# Patient Record
Sex: Male | Born: 1970
Health system: Southern US, Community
[De-identification: ages and names within clinical notes are randomized; demographics above are authoritative.]

## PROBLEM LIST (undated history)

## (undated) ENCOUNTER — Emergency Department (HOSPITAL_COMMUNITY): Payer: Self-pay | Source: Home / Self Care

## (undated) DIAGNOSIS — N289 Disorder of kidney and ureter, unspecified: Secondary | ICD-10-CM

## (undated) DIAGNOSIS — I10 Essential (primary) hypertension: Secondary | ICD-10-CM

## (undated) DIAGNOSIS — J45909 Unspecified asthma, uncomplicated: Secondary | ICD-10-CM

## (undated) DIAGNOSIS — I509 Heart failure, unspecified: Secondary | ICD-10-CM

## (undated) DIAGNOSIS — E119 Type 2 diabetes mellitus without complications: Secondary | ICD-10-CM

---

## 2017-01-04 ENCOUNTER — Inpatient Hospital Stay
Admission: RE | Admit: 2017-01-04 | Discharge: 2017-01-14 | Payer: Self-pay | Attending: Internal Medicine | Admitting: Internal Medicine

## 2017-01-04 DIAGNOSIS — R109 Unspecified abdominal pain: Secondary | ICD-10-CM

## 2017-01-04 DIAGNOSIS — Z4689 Encounter for fitting and adjustment of other specified devices: Secondary | ICD-10-CM

## 2017-01-04 DIAGNOSIS — R140 Abdominal distension (gaseous): Secondary | ICD-10-CM

## 2017-01-04 DIAGNOSIS — Z0189 Encounter for other specified special examinations: Secondary | ICD-10-CM

## 2017-01-04 DIAGNOSIS — Z4659 Encounter for fitting and adjustment of other gastrointestinal appliance and device: Secondary | ICD-10-CM

## 2017-01-04 DIAGNOSIS — R52 Pain, unspecified: Secondary | ICD-10-CM

## 2017-01-04 HISTORY — DX: Essential (primary) hypertension: I10

## 2017-01-04 HISTORY — DX: Disorder of kidney and ureter, unspecified: N28.9

## 2017-01-04 HISTORY — DX: Unspecified asthma, uncomplicated: J45.909

## 2017-01-04 HISTORY — DX: Heart failure, unspecified: I50.9

## 2017-01-04 HISTORY — DX: Type 2 diabetes mellitus without complications: E11.9

## 2017-01-05 LAB — LIPASE, BLOOD: LIPASE: 287 U/L — AB (ref 11–51)

## 2017-01-07 ENCOUNTER — Other Ambulatory Visit (HOSPITAL_COMMUNITY): Payer: Self-pay

## 2017-01-07 LAB — COMPREHENSIVE METABOLIC PANEL
ALBUMIN: 2.6 g/dL — AB (ref 3.5–5.0)
ALK PHOS: 98 U/L (ref 38–126)
ALT: 32 U/L (ref 17–63)
ALT: 32 U/L (ref 17–63)
ANION GAP: 11 (ref 5–15)
AST: 49 U/L — ABNORMAL HIGH (ref 15–41)
AST: 37 U/L (ref 15–41)
Albumin: 2.3 g/dL — ABNORMAL LOW (ref 3.5–5.0)
Alkaline Phosphatase: 107 U/L (ref 38–126)
Anion gap: 11 (ref 5–15)
BILIRUBIN TOTAL: 0.8 mg/dL (ref 0.3–1.2)
BUN: 25 mg/dL — ABNORMAL HIGH (ref 6–20)
BUN: 15 mg/dL (ref 6–20)
CALCIUM: 8.4 mg/dL — AB (ref 8.9–10.3)
CALCIUM: 8.6 mg/dL — AB (ref 8.9–10.3)
CO2: 24 mmol/L (ref 22–32)
CO2: 24 mmol/L (ref 22–32)
CREATININE: 0.95 mg/dL (ref 0.61–1.24)
CREATININE: 0.92 mg/dL (ref 0.61–1.24)
Chloride: 104 mmol/L (ref 101–111)
Chloride: 103 mmol/L (ref 101–111)
GFR calc Af Amer: >60 mL/min (ref >60)
GFR calc non Af Amer: >60 mL/min (ref >60)
GLUCOSE: 196 mg/dL — AB (ref 65–99)
Glucose, Bld: 252 mg/dL — ABNORMAL HIGH (ref 65–99)
Potassium: 3.2 mmol/L — ABNORMAL LOW (ref 3.5–5.1)
Potassium: 3.4 mmol/L — ABNORMAL LOW (ref 3.5–5.1)
SODIUM: 139 mmol/L (ref 135–145)
SODIUM: 138 mmol/L (ref 135–145)
TOTAL PROTEIN: 5.7 g/dL — AB (ref 6.5–8.1)
Total Bilirubin: 0.9 mg/dL (ref 0.3–1.2)
Total Protein: 5.7 g/dL — ABNORMAL LOW (ref 6.5–8.1)

## 2017-01-07 LAB — CBC
HCT: 29.1 % — ABNORMAL LOW (ref 39.0–52.0)
Hemoglobin: 9.1 g/dL — ABNORMAL LOW (ref 13.0–17.0)
MCH: 28.3 pg (ref 26.0–34.0)
MCHC: 31.3 g/dL (ref 30.0–36.0)
MCV: 90.4 fL (ref 78.0–100.0)
PLATELETS: 482 10*3/uL — AB (ref 150–400)
RBC: 3.22 MIL/uL — AB (ref 4.22–5.81)
RDW: 13.9 % (ref 11.5–15.5)
WBC: 13 10*3/uL — ABNORMAL HIGH (ref 4.0–10.5)

## 2017-01-07 LAB — LIPASE, BLOOD: Lipase: 296 U/L — ABNORMAL HIGH (ref 11–51)

## 2017-01-08 LAB — POTASSIUM: Potassium: 3.5 mmol/L (ref 3.5–5.1)

## 2017-01-08 LAB — MAGNESIUM: MAGNESIUM: 2 mg/dL (ref 1.7–2.4)

## 2017-01-09 ENCOUNTER — Encounter: Payer: Self-pay | Admitting: Radiology

## 2017-01-09 ENCOUNTER — Other Ambulatory Visit (HOSPITAL_COMMUNITY): Payer: Self-pay

## 2017-01-09 MED ORDER — IOPAMIDOL (ISOVUE-300) INJECTION 61%
INTRAVENOUS | Status: AC
  Administered 2017-01-09: 100 mL
  Filled 2017-01-09: qty 100

## 2017-01-10 LAB — LIPASE, BLOOD: Lipase: 249 U/L — ABNORMAL HIGH (ref 11–51)

## 2017-01-11 ENCOUNTER — Other Ambulatory Visit (HOSPITAL_COMMUNITY): Payer: Self-pay

## 2017-01-11 MED ORDER — LIDOCAINE VISCOUS 2 % MT SOLN
OROMUCOSAL | Status: AC
Start: 2017-01-11 — End: 2017-01-11
  Filled 2017-01-11: qty 15

## 2017-01-11 MED ORDER — IOPAMIDOL (ISOVUE-300) INJECTION 61%
INTRAVENOUS | Status: AC
  Filled 2017-01-11: qty 50

## 2017-01-11 MED ORDER — IOPAMIDOL (ISOVUE-300) INJECTION 61%
50.0000 mL | Freq: Once | INTRAVENOUS | Status: AC | PRN
  Administered 2017-01-11: 30 mL via INTRAVENOUS

## 2017-01-11 MED ORDER — LIDOCAINE VISCOUS 2 % MT SOLN
15.0000 mL | Freq: Once | OROMUCOSAL | Status: AC
  Administered 2017-01-11: 3 mL via OROMUCOSAL

## 2017-01-12 NOTE — Consult Note (Signed)
[facility]  Prior consultation note already in chart.

## 2017-01-12 NOTE — Consult Note (Signed)
[facility] Consultation Note  Referring Provider: Dr. Sharyon MedicusHijazi Scientist, product/process development([facility]) Primary Care Physician:  No primary care provider on file.  Reason for Consultation:  pancreatitis  HPI: Alphonsa GinJanson Mandarino is a 46 y.o. male admitted for management of pancreatitis, transferred from outside facility where he presented with pancreatitis about two weeks ago, complicated by ARDS/renal issues (had been intubated but now extubated).  Outside records scant. Etiology of pancreatitis unclear; he denies alcohol > 10 years; no clear gallstones noted; possibly medication related; MRCP outside facility showed pancreas divisum but no santorinicele; no obvious choledocholithiasis.  Patient, despite extensive attempts at conversation, has very little information to add; he has abdominal pain and nausea, but can't tell me when symptoms started, doesn't know if he's ever had pancreatitis before.  CT recently showed large peripancreatic fluid collection (with a bit of a rind based on my evaluation) with possible necrosis and some gastric compression and some ascites.   Past Medical History:  Diagnosis Date  . Asthma   . CHF (congestive heart failure) (HCC)   . Diabetes mellitus without complication (HCC)   . Hypertension   . Renal insufficiency     No past surgical history on file.  Prior to Admission medications   Not on File    No current facility-administered medications for this encounter.     Allergies as of 01/04/2017  . (Not on File)    No family history on file.  Social History   Social History  . Marital status: N/A    Spouse name: N/A  . Number of children: N/A  . Years of education: N/A   Occupational History  . Not on file.   Social History Main Topics  . Smoking status: Not on file  . Smokeless tobacco: Not on file  . Alcohol use Not on file  . Drug use: Unknown  . Sexual activity: Not on file   Other Topics Concern  . Not on file   Social History Narrative  .  No narrative on file    Review of Systems: As per HPI, others negative  Physical Exam: Vital signs in last 24 hours:     General:   Alert,  Well-developed, well-nourished, pleasant and cooperative in NAD Head:  Normocephalic and atraumatic. Eyes:  Sclera clear, no icterus.   Conjunctiva pink. Ears:  Normal auditory acuity. Nose:  Nasoenteric tube in place, No deformity, discharge,  or lesions. Mouth:  No deformity or lesions.  Oropharynx pink & moist. Neck:  Supple; no masses or thyromegaly. Lungs:  Clear throughout to auscultation.   No wheezes, crackles, or rhonchi. No acute distress. Heart:  Regular rate and rhythm; no murmurs, clicks, rubs,  or gallops. Abdomen:  Soft,protuberant, mild distended, generalized tenderness. No masses, hepatosplenomegaly or hernias noted. Normal bowel sounds, without guarding, and without rebound.     Msk:  Symmetrical without gross deformities. Normal posture. Pulses:  Normal pulses noted. Extremities:  Without clubbing or edema. Neurologic:  Alert and  oriented x4;  grossly normal neurologically. Skin:  Intact without significant lesions or rashes. Psych:  Alert and cooperative. Normal mood and affect.   Lab Results: No results for input(s): WBC, HGB, HCT, PLT in the last 72 hours. BMET No results for input(s): NA, K, CL, CO2, GLUCOSE, BUN, CREATININE, CALCIUM in the last 72 hours. LFT No results for input(s): PROT, ALBUMIN, AST, ALT, ALKPHOS, BILITOT, BILIDIR, IBILI in the last 72 hours. PT/INR No results for input(s): LABPROT, INR in the last 72 hours.  Studies/Results:  Dg Abd 1 View  Result Date: 01/11/2017 CLINICAL DATA:  Enteric tube placement EXAM: ABDOMEN - 1 VIEW COMPARISON:  01/09/2017 CT abdomen/ pelvis. FINDINGS: Fluoroscopy time 0 minutes 24 seconds. Single spot fluoroscopic radiographs of the abdomen demonstrates enteric tube tip in the transverse duodenum. Instilled contrast is noted within the lumen of the distal duodenum. No  evidence of pneumatosis or pneumoperitoneum on this single view. IMPRESSION: Enteric tube terminates in the transverse duodenum. Electronically Signed   By: Delbert Phenix M.D.   On: 01/11/2017 08:48   Impression:  1.  Pancreatitis, idiopathic, severe, with prior ARDS and now likely necrosis with acute necrotic peripancreatic fluid collection which appears to be evolving into walled off necrosis.  No clear evidence of infected necrosis.  Plan:  1.  Agree with nasoenteric feeds, advancing as tolerated. 2.  Would try some sips of clear liquids and see how he does. 3.  If trouble tolerating any kind of peroral diet, or if he has escalating pain, would consider transfer to tertiary center for consideration of endoscopic cystgastrostomy (procedure which is not done by any physician in [place]). 4.  Judicious analgesics, noting that narcotics may well be exacerbating his nausea. 5.  Labs today. 6.  [facility] will follow.   LOS: 0 days   Kingstyn Deruiter M  01/12/2017, 11:34 AM  Pager [phone] If no answer or after 5 PM call [phone]

## 2017-01-13 LAB — CBC WITH DIFFERENTIAL/PLATELET
Basophils Absolute: 0.1 10*3/uL (ref 0.0–0.1)
Basophils Relative: 0 %
EOS ABS: 0.6 10*3/uL (ref 0.0–0.7)
Eosinophils Relative: 4 %
HEMATOCRIT: 31.8 % — AB (ref 39.0–52.0)
Hemoglobin: 10.2 g/dL — ABNORMAL LOW (ref 13.0–17.0)
LYMPHS ABS: 3 10*3/uL (ref 0.7–4.0)
LYMPHS PCT: 19 %
MCH: 28.5 pg (ref 26.0–34.0)
MCHC: 32.1 g/dL (ref 30.0–36.0)
MCV: 88.8 fL (ref 78.0–100.0)
MONO ABS: 1.1 10*3/uL — AB (ref 0.1–1.0)
MONOS PCT: 7 %
NEUTROS PCT: 70 %
Neutro Abs: 11 10*3/uL — ABNORMAL HIGH (ref 1.7–7.7)
Platelets: 612 10*3/uL — ABNORMAL HIGH (ref 150–400)
RBC: 3.58 MIL/uL — ABNORMAL LOW (ref 4.22–5.81)
RDW: 14.1 % (ref 11.5–15.5)
WBC: 15.8 10*3/uL — AB (ref 4.0–10.5)

## 2017-01-13 LAB — COMPREHENSIVE METABOLIC PANEL
ALT: 50 U/L (ref 17–63)
ANION GAP: 11 (ref 5–15)
AST: 56 U/L — AB (ref 15–41)
Albumin: 2.2 g/dL — ABNORMAL LOW (ref 3.5–5.0)
Alkaline Phosphatase: 324 U/L — ABNORMAL HIGH (ref 38–126)
BILIRUBIN TOTAL: 0.8 mg/dL (ref 0.3–1.2)
BUN: 18 mg/dL (ref 6–20)
CO2: 24 mmol/L (ref 22–32)
Calcium: 8.5 mg/dL — ABNORMAL LOW (ref 8.9–10.3)
Chloride: 96 mmol/L — ABNORMAL LOW (ref 101–111)
Creatinine, Ser: 0.81 mg/dL (ref 0.61–1.24)
Glucose, Bld: 176 mg/dL — ABNORMAL HIGH (ref 65–99)
POTASSIUM: 4 mmol/L (ref 3.5–5.1)
Sodium: 131 mmol/L — ABNORMAL LOW (ref 135–145)
TOTAL PROTEIN: 5.4 g/dL — AB (ref 6.5–8.1)

## 2017-01-13 NOTE — Progress Notes (Signed)
Wife called us back; she wants to proceed with tertiary center transfer to tertiary center for evaluation of possible endoscopic cystgastrostomy.  Please let me know if there is any way I can be of further assistance.

## 2017-01-13 NOTE — Progress Notes (Signed)
Subjective: Continued intermittent abdominal pain and nausea.  Objective: PE: GEN:  NAD ABD:  Protuberant, distended, diffusely mildly tender without peritonitis, hyperactive bowel sounds  Lab Results: CBC    Component Value Date/Time   WBC 15.8 (H) 01/13/2017 0712   RBC 3.58 (L) 01/13/2017 0712   HGB 10.2 (L) 01/13/2017 0712   HCT 31.8 (L) 01/13/2017 0712   PLT 612 (H) 01/13/2017 0712   MCV 88.8 01/13/2017 0712   MCH 28.5 01/13/2017 0712   MCHC 32.1 01/13/2017 0712   RDW 14.1 01/13/2017 0712   LYMPHSABS 3.0 01/13/2017 0712   MONOABS 1.1 (H) 01/13/2017 0712   EOSABS 0.6 01/13/2017 0712   BASOSABS 0.1 01/13/2017 0712   CMP     Component Value Date/Time   NA 131 (L) 01/13/2017 0712   K 4.0 01/13/2017 0712   CL 96 (L) 01/13/2017 0712   CO2 24 01/13/2017 0712   GLUCOSE 176 (H) 01/13/2017 0712   BUN 18 01/13/2017 0712   CREATININE 0.81 01/13/2017 0712   CALCIUM 8.5 (L) 01/13/2017 0712   PROT 5.4 (L) 01/13/2017 0712   ALBUMIN 2.2 (L) 01/13/2017 0712   AST 56 (H) 01/13/2017 0712   ALT 50 01/13/2017 0712   ALKPHOS 324 (H) 01/13/2017 0712   BILITOT 0.8 01/13/2017 0712   GFRNONAA >60 01/13/2017 0712   GFRAA >60 01/13/2017 [id number]   Assessment:  1.  Gallstone pancreatitis, severe, with large evolving walled off necrotic fluid collection.  Fluid collection compresses stomach and portion of portal vein.  No biliary obstruction.  Nausea but no frank gastric outlet obstruction. 2.  Abdominal pain.  Unclear how much is due from pancreatitis or cyst. 3.  Feeding difficulties. 4.  Protein calorie malnutrition.  Plan:  1.  Nasoenteric feeds. 2.  Trial of clear liquids is ok. 3.  Discussed with patient and his wife Beth option of tertiary center transfer for consideration of endoscopic cystgastrostomy.  They will think things over and make a decision in the next day or so. 4.  Judicious analgesics. 5.  [facility] will follow.   Freddy JakschOUTLAW,Cera Rorke M 01/13/2017, 9:11 AM   Pager  [phone] If no answer or after 5 PM call [phone]

## 2017-01-14 LAB — IGG 4: IGG 4: 35 mg/dL (ref 2–96)

## 2017-01-14 LAB — ANTINUCLEAR ANTIBODIES, IFA: ANA Ab, IFA: NEGATIVE

## 2017-02-03 DEATH — deceased

## 2018-10-12 IMAGING — CT CT ABD-PELV W/ CM
2 of 4 series · 13 of 42 positions shown, 15 images · IV contrast (iopamidol)
Comparison: None.

CLINICAL DATA: Recent pancreatitis with increasing pain. Nausea and
vomiting.

EXAM:
CT ABDOMEN AND PELVIS WITH CONTRAST
TECHNIQUE: Multidetector CT imaging of the abdomen and pelvis was performed
using the standard protocol following bolus administration of
intravenous contrast.
CONTRAST:  100mL [id number] IOPAMIDOL ([id number]) INJECTION 61%

[Series 3: a/p w/ 5mm · axial · 0.71mm/px · z∈[+682,+1132]mm · 10 of 100 slices shown, 12 images]
[im 5/100  soft-tissue]
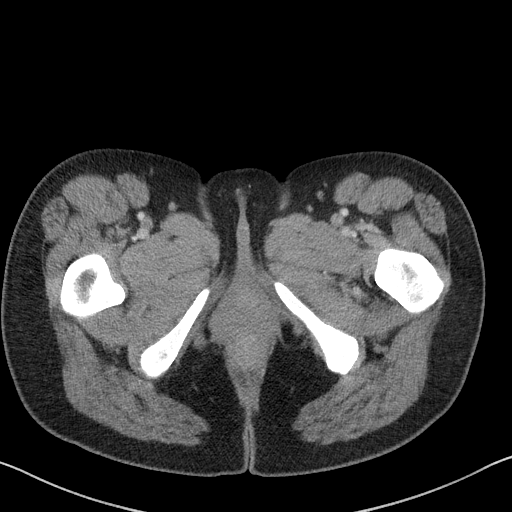
[im 5/100  bone]
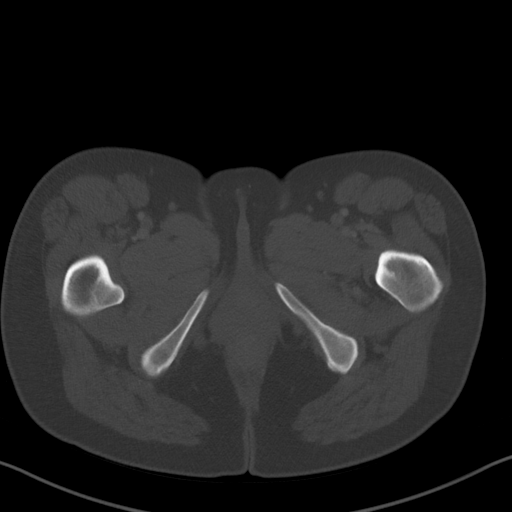
[im 15/100  soft-tissue]
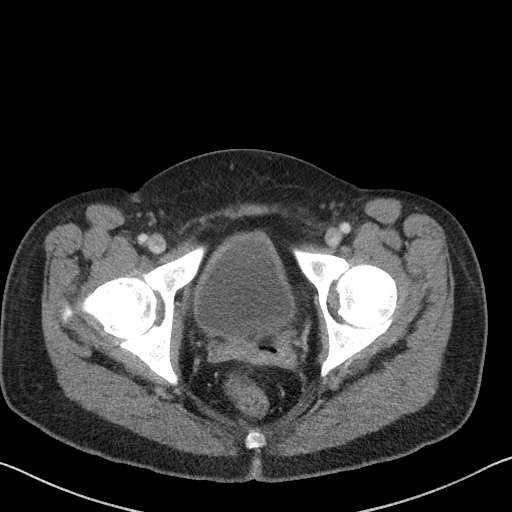
[im 25/100  soft-tissue]
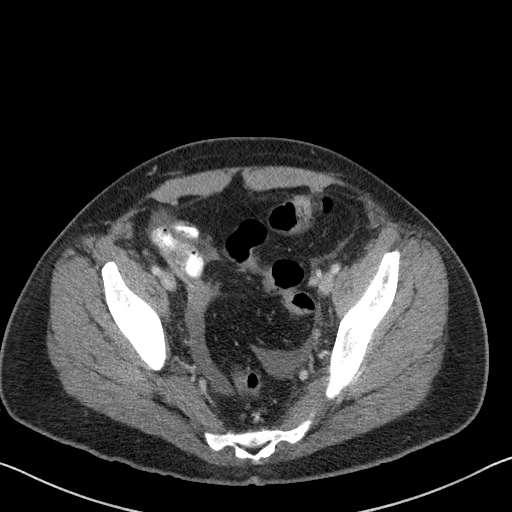
[im 35/100  soft-tissue]
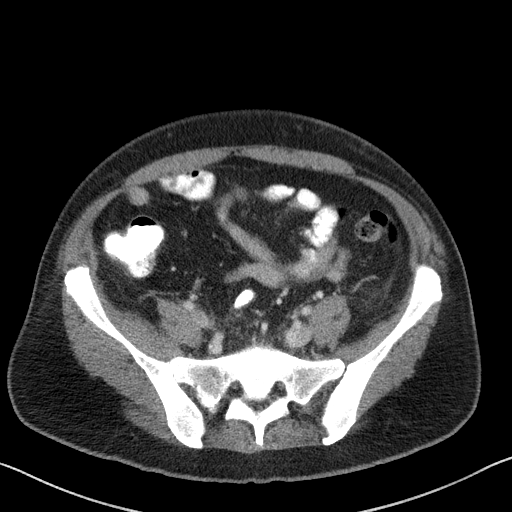
[im 45/100  soft-tissue]
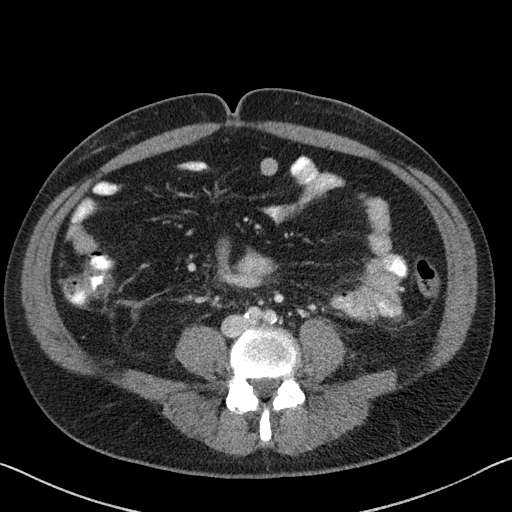
[im 55/100  soft-tissue]
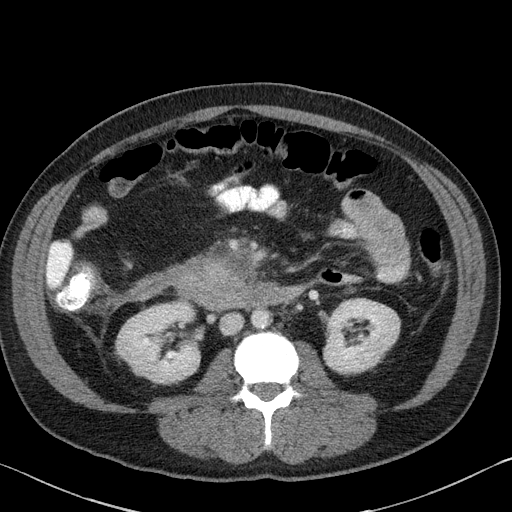
[im 65/100  soft-tissue]
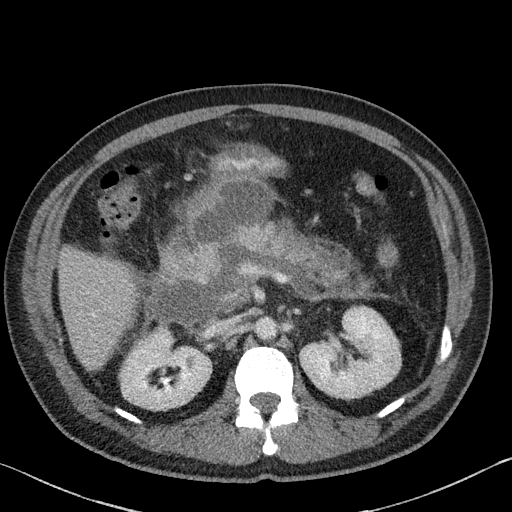
[im 75/100  soft-tissue]
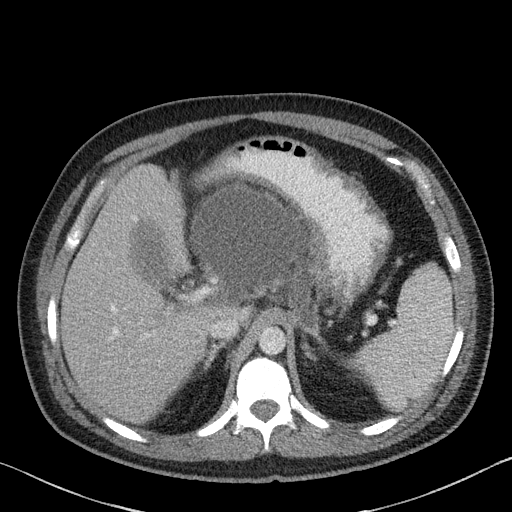
[im 85/100  soft-tissue]
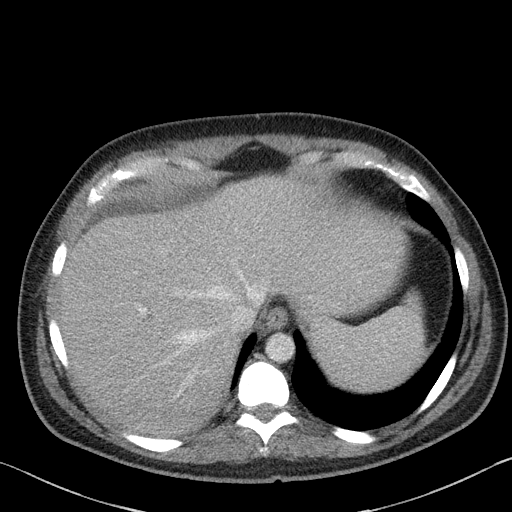
[im 85/100  bone]
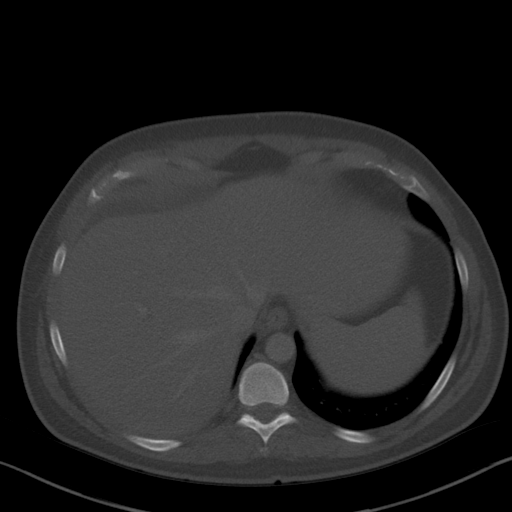
[im 95/100  soft-tissue]
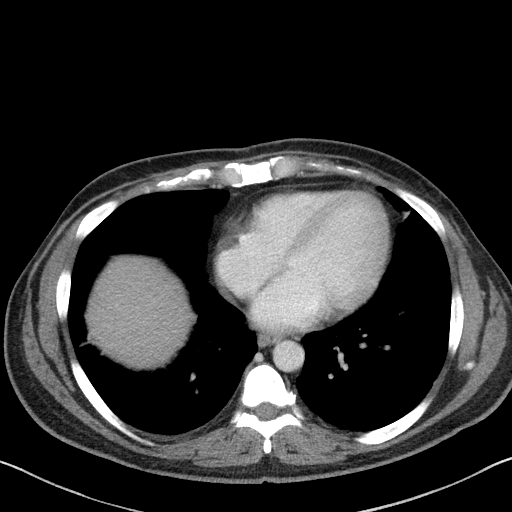

[Series 6: a/p w/ cor · coronal · 0.63mm/px · 3 of 148 slices shown]
[im 50/148  soft-tissue]
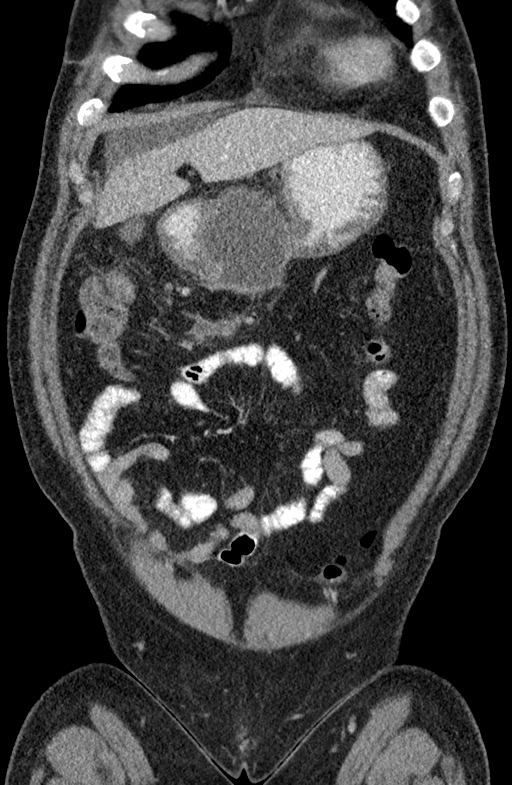
[im 66/148  soft-tissue]
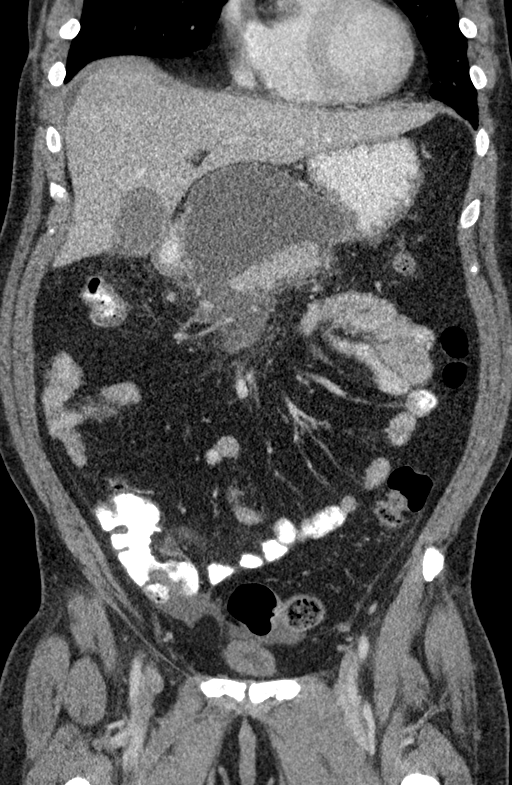
[im 82/148  soft-tissue]
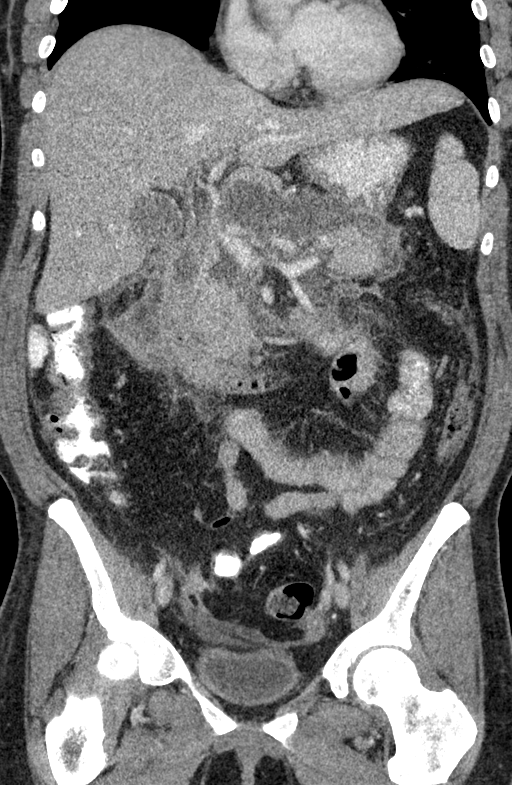

[13 of 42 positions shown; findings below may reference images not displayed]

FINDINGS: Lower chest: There is a tiny right pleural effusion. Scattered
opacities in lung bases are favored represent atelectasis rather
than early infiltrate. No suspicious nodules or masses.

Hepatobiliary: Hepatic steatosis is identified. No liver masses are
identified. Mild pericholecystic fluid is seen. No obvious stones or
wall thickening. The intrahepatic branches of the portal vein are
well opacified. Periportal edema is seen around the right anterior
portal branch. The portal vein is narrowed as it traverses the porta
hepatis. It does not appear to be completely compressed based on
coronal images. No thrombosis is seen.

Pancreas: There is fluid surrounding the pancreas consistent with
recent pancreatitis. Mild adjacent fat stranding is identified as
well. There is a large fluid collection most prominent anterior to
the body and neck of the pancreas but also extending posterior and
lateral the pancreatic head. It is difficult to measure this large
fluid collection but the anterior component measures 13.5 by 7.5 cm
on series 3, image 30. This component appears to connect with the
component lateral and posterior to the pancreatic head. Taking this
extension into account, I suspect the collection spans nearly 18 cm
in transverse dimension. It is this collection which compresses the
portal vein as it traverses the porta hepatis. The fluid also
encompasses the superior mesenteric vein without occlusion. No
pancreatic hemorrhages identified. The pancreas is thinned in the
region the pancreatic neck as seen on axial image 37 suggesting
pancreatic necrosis. No other sites of pancreatic necrosis
identified.

Spleen: Normal in size without focal abnormality.

Adrenals/Urinary Tract: Adrenal glands are unremarkable. Kidneys are
normal, without renal calculi, focal lesion, or hydronephrosis.
Bladder is unremarkable.

Stomach/Bowel: The stomach is displaced anteriorly by the large
peripancreatic fluid collection. There also appears to be some
gastric wall thickening in the antrum and distal body, likely
secondary to the recent pancreatitis. The proximal duodenum is also
displaced to the right. No bowel obstruction. The small bowel is
otherwise normal. The colon is normal. The appendix is normal.

Vascular/Lymphatic: Atherosclerotic changes are seen in the non
aneurysmal aorta. No adenopathy.

Reproductive: Prostate is unremarkable.

Other: Ascites is seen adjacent to the liver, tracking down the
right pericolic gutter into the pelvis. The fluid adjacent to the
gallbladder may be secondary to the ascites.

Musculoskeletal: No acute or significant osseous findings.
IMPRESSION: 1. There is a large peripancreatic fluid collection consistent with
the history of recent pancreatitis. This fluid collection nearly
compresses the portal vein as it traverses the porta hepatis. No
portal vein thrombus seen at this time. Thinning of the pancreatic
neck is concerning for pancreatic necrosis in this region. Gastric
wall thickening abutting the peripancreatic fluid collection is
likely secondary inflammation from the pancreatitis. The ascites in
the abdomen and pelvis is likely secondary to pancreatitis. No
pancreatic hemorrhage.
2. Fluid adjacent to the gallbladder is of uncertain etiology given
ascites elsewhere.
3. Tiny right pleural effusion.
4. Atherosclerosis.
These results will be called to the ordering clinician or
representative by the Radiologist Assistant, and communication
documented in the PACS or zVision Dashboard.

## 2018-10-14 IMAGING — RF DG ABDOMEN 1V
1 series · 1 of 1 positions shown · non-contrast
Comparison: 01/09/2017 CT abdomen/ pelvis.

CLINICAL DATA: Enteric tube placement

EXAM:
ABDOMEN - 1 VIEW

[Series 1: cp_standard · 0.26mm/px · 1 of 1 slices shown]
[im 1/1]
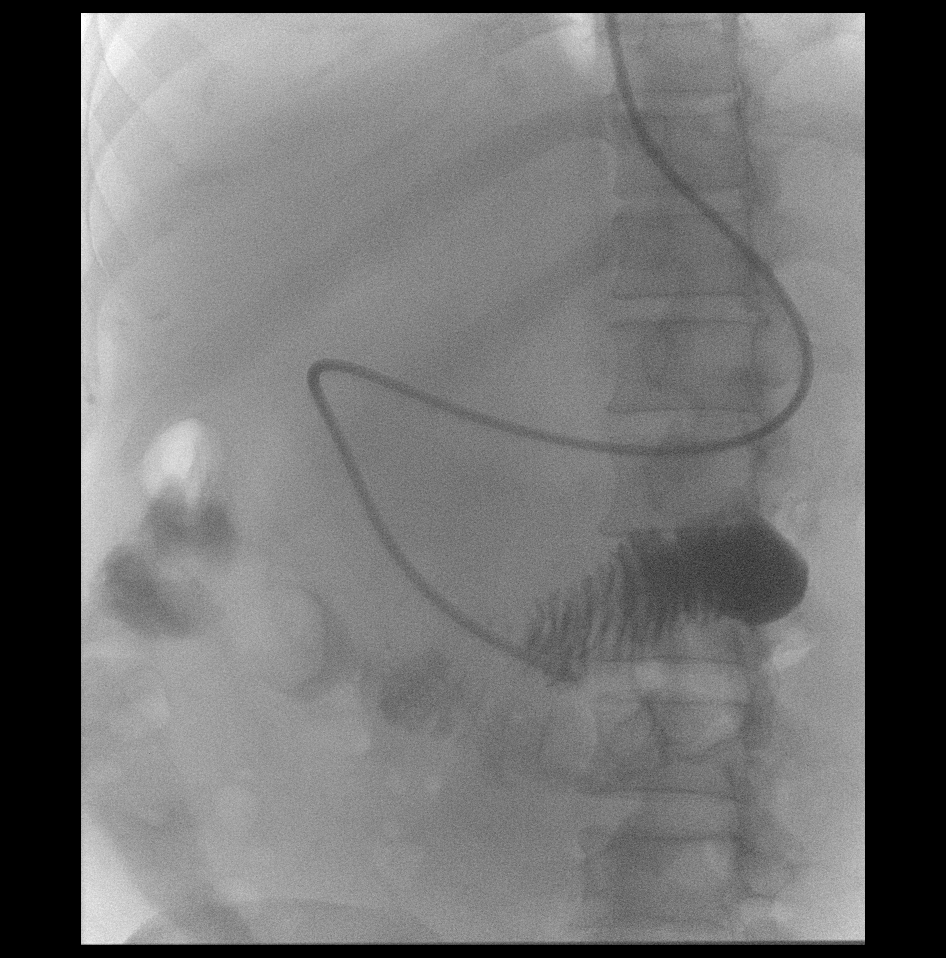

[1 of 1 positions shown; findings below may reference images not displayed]

FINDINGS: Fluoroscopy time 0 minutes 24 seconds. Single spot fluoroscopic
radiographs of the abdomen demonstrates enteric tube tip in the
transverse duodenum. Instilled contrast is noted within the lumen of
the distal duodenum. No evidence of pneumatosis or pneumoperitoneum
on this single view.
IMPRESSION: Enteric tube terminates in the transverse duodenum.
